# Patient Record
Sex: Male | Born: 1985 | Race: Black or African American | Hispanic: No | Marital: Married | Smoking: Never smoker
Health system: Southern US, Community
[De-identification: ages and names within clinical notes are randomized; demographics above are authoritative.]

---
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
## 2010-09-05 ENCOUNTER — Ambulatory Visit
Admission: RE | Admit: 2010-09-05 | Discharge: 2010-09-05 | Disposition: A | Discharge status: Home / Self Care | Payer: No Typology Code available for payment source | Source: Ambulatory Visit | Attending: Occupational Medicine | Admitting: Occupational Medicine

## 2010-09-05 ENCOUNTER — Other Ambulatory Visit: Payer: Self-pay | Admitting: Occupational Medicine

## 2010-09-05 DIAGNOSIS — Z021 Encounter for pre-employment examination: Secondary | ICD-10-CM

## 2012-05-13 IMAGING — CR DG CHEST 1V
1 series · 1 of 1 positions shown · non-contrast
Comparison: None.

CLINICAL DATA: Pre employment physical

CHEST - 1 VIEW

[view not recorded]
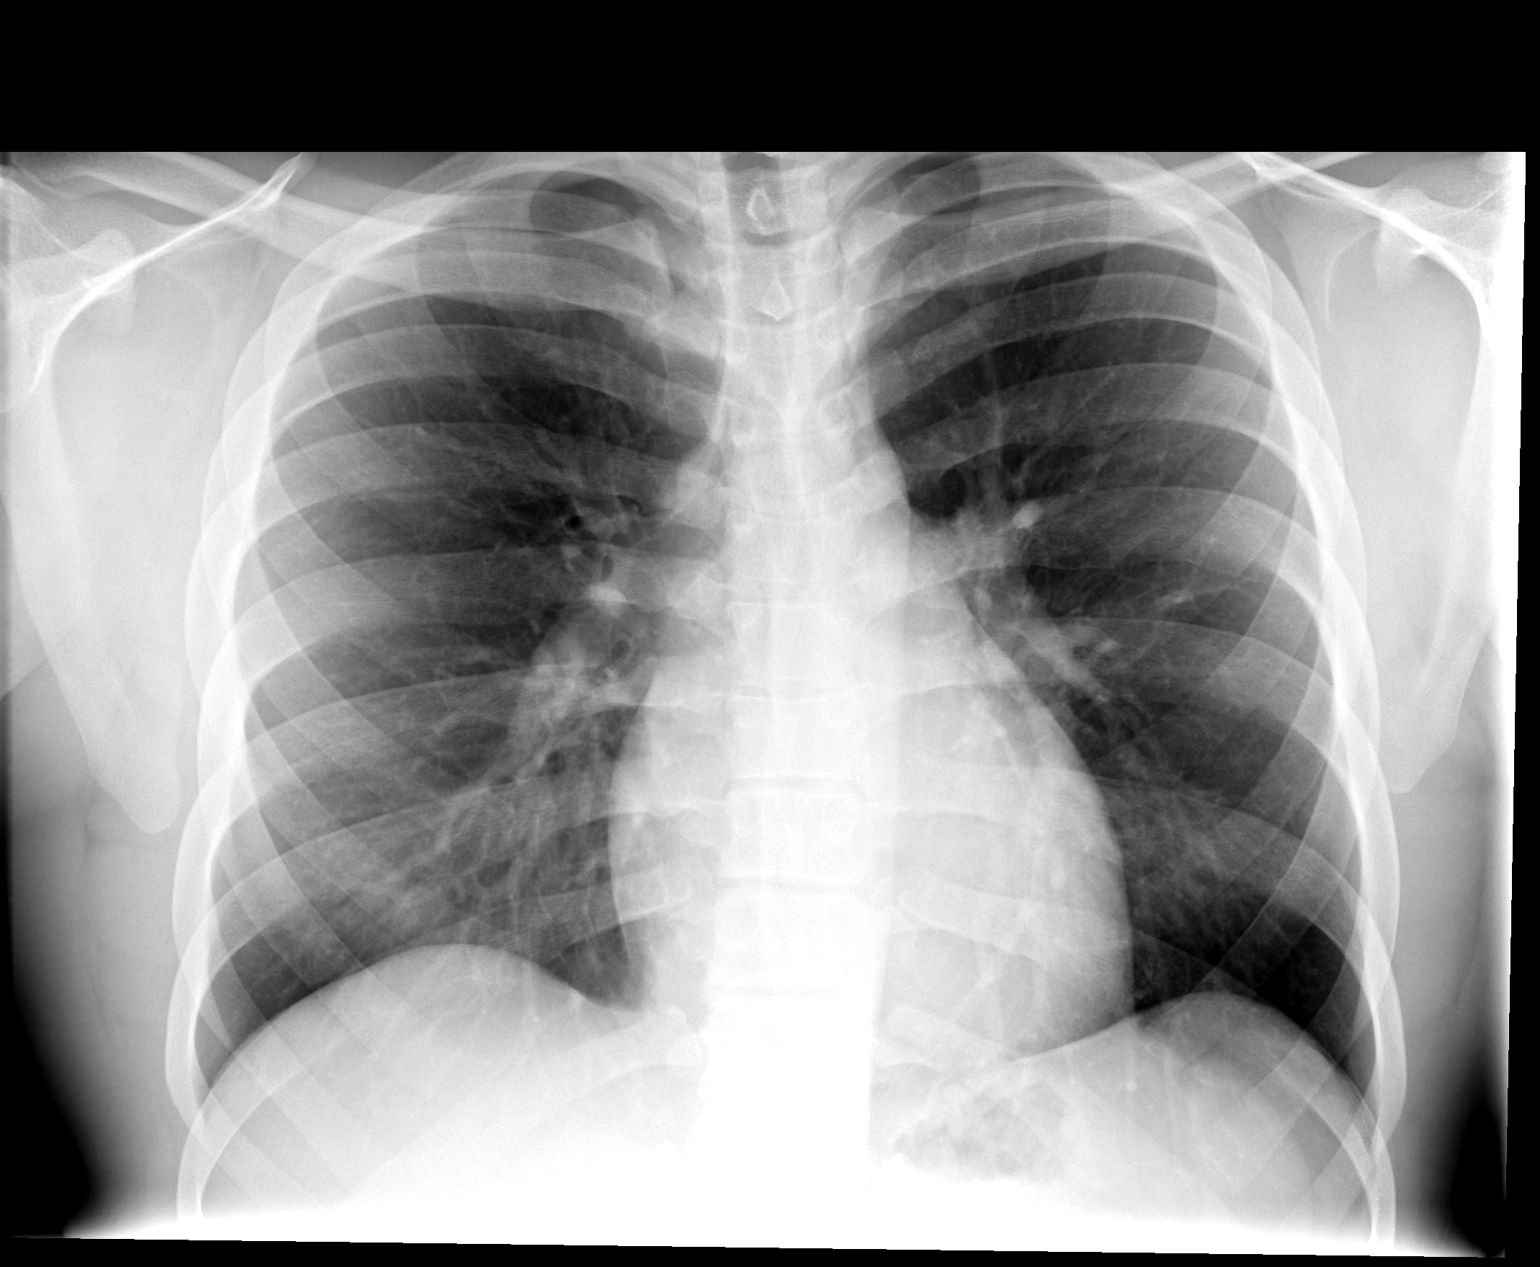

[1 of 1 positions shown; findings below may reference images not displayed]

FINDINGS: The lungs are clear.  Mediastinal contours appear normal.
The heart is within normal limits in size.  No bony abnormality is
seen.
IMPRESSION: No active lung disease.

## 2014-04-23 ENCOUNTER — Encounter (HOSPITAL_COMMUNITY): Payer: Self-pay | Admitting: *Deleted

## 2014-04-23 ENCOUNTER — Emergency Department (HOSPITAL_COMMUNITY)
Admission: EM | Admit: 2014-04-23 | Discharge: 2014-04-23 | Disposition: A | Discharge status: Home / Self Care | Payer: Worker's Compensation | Attending: Emergency Medicine | Admitting: Emergency Medicine

## 2014-04-23 DIAGNOSIS — W461XXA Contact with contaminated hypodermic needle, initial encounter: Secondary | ICD-10-CM

## 2014-04-23 DIAGNOSIS — Z7721 Contact with and (suspected) exposure to potentially hazardous body fluids: Secondary | ICD-10-CM | POA: Diagnosis present

## 2014-04-23 LAB — COMPREHENSIVE METABOLIC PANEL
ALK PHOS: 70 U/L (ref 39–117)
ALT: 24 U/L (ref 0–53)
AST: 26 U/L (ref 0–37)
Albumin: 4.8 g/dL (ref 3.5–5.2)
Anion gap: 9 (ref 5–15)
BUN: 18 mg/dL (ref 6–23)
CO2: 26 mmol/L (ref 19–32)
Calcium: 9.7 mg/dL (ref 8.4–10.5)
Chloride: 104 mEq/L (ref 96–112)
Creatinine, Ser: 0.99 mg/dL (ref 0.50–1.35)
GFR calc Af Amer: >90 mL/min (ref >90)
GLUCOSE: 79 mg/dL (ref 70–99)
POTASSIUM: 4.6 mmol/L (ref 3.5–5.1)
Sodium: 139 mmol/L (ref 135–145)
Total Bilirubin: 1.2 mg/dL (ref 0.3–1.2)
Total Protein: 7.3 g/dL (ref 6.0–8.3)

## 2014-04-23 LAB — CBC WITH DIFFERENTIAL/PLATELET
Basophils Absolute: 0 10*3/uL (ref 0.0–0.1)
Basophils Relative: 0 % (ref 0–1)
EOS ABS: 0.1 10*3/uL (ref 0.0–0.7)
Eosinophils Relative: 1 % (ref 0–5)
HCT: 47.1 % (ref 39.0–52.0)
HEMOGLOBIN: 15.8 g/dL (ref 13.0–17.0)
LYMPHS ABS: 1.6 10*3/uL (ref 0.7–4.0)
LYMPHS PCT: 24 % (ref 12–46)
MCH: 30.3 pg (ref 26.0–34.0)
MCHC: 33.5 g/dL (ref 30.0–36.0)
MCV: 90.4 fL (ref 78.0–100.0)
MONOS PCT: 7 % (ref 3–12)
Monocytes Absolute: 0.5 10*3/uL (ref 0.1–1.0)
NEUTROS PCT: 68 % (ref 43–77)
Neutro Abs: 4.6 10*3/uL (ref 1.7–7.7)
PLATELETS: 248 10*3/uL (ref 150–400)
RBC: 5.21 MIL/uL (ref 4.22–5.81)
RDW: 12.3 % (ref 11.5–15.5)
WBC: 6.8 10*3/uL (ref 4.0–10.5)

## 2014-04-23 LAB — AMYLASE: AMYLASE: 82 U/L (ref 0–105)

## 2014-04-23 LAB — RAPID HIV SCREEN (WH-MAU): Rapid HIV Screen: NONREACTIVE
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
## 2014-04-23 NOTE — Discharge Instructions (Signed)
Body Fluid Exposure Information °People may come into contact with blood and other body fluids under various circumstances. In some cases, body fluids may contain germs (bacteria or viruses) that cause infections. These germs can be spread when another person's body fluids come into contact with your skin, mouth, eyes, or genitals.  °Exposure to body fluids that may contain infectious material is a common problem for people providing care for others who are ill. It can occur when a person is performing health care tasks in the workplace or when taking care of a family member at home. Other common methods of exposure include injection drug use, sharing needles, and sexual activity. °The risk of an infection spreading through body fluid exposure is small and depends on a variety of factors. This includes the type of body fluid, the nature of the exposure, and the health status of the person who was the source of the body fluids. Your health care provider can help you assess the risk. °WHAT TYPES OF BODY FLUID CAN SPREAD INFECTION? °The following types of body fluid have the potential to spread infections: °· Blood. °· Semen. °· Vaginal secretions. °· Urine. °· Feces. °· Saliva. °· Nasal or eye discharge. °· Breast milk. °· Amniotic fluid and fluids surrounding body organs. °WHAT ARE SOME FIRST-AID MEASURES FOR BODY FLUID EXPOSURE? °The following steps should be taken as soon as possible after a person is exposed to body fluids: °Intact Skin °· For contact with closed skin, wash the area with soap and water. °Broken Skin °· For contact with broken skin (a wound), wash the area with soap and water. Let the area bleed a little. Then place a bandage or clean towel on the wound, applying gentle pressure to stop the bleeding. Do not squeeze or rub the area. °· Use just water or hand sanitizer if a sink with soap is not available. °· Do not use harsh chemicals such as bleach or iodine. °Eyes °· Rinse the eyes with water or  saline for 30 seconds. °· If the person is wearing contact lenses, leave the contact lenses in while rinsing the eyes. Once the rinsing is complete, remove the contact lenses. °Mouth °· Spit out the fluids. Rinse and spit with water 4-5 times. °In addition, you should remove any clothing that comes into contact with body fluids. However, if body fluid exposure results from sexual assault, seek medical care immediately without changing clothes or bathing. °WHEN SHOULD YOU SEEK HELP? °After performing the proper first-aid steps, you should contact your health care provider or seek emergency care right away if blood or other body fluids made contact with areas of broken skin or openings such as the eyes or mouth. If the exposure to body fluid happened in the workplace, you should report it to your work supervisor immediately. Many workplaces have procedures in place for exposure situations. °WHAT WILL HAPPEN AFTER YOU REPORT THE EXPOSURE? °Your health care provider will ask you several questions. Information requested may include: °· Your medical history, including vaccination records. °· Date and time of the exposure. °· Whether you saw body fluids during the exposure. °· Type of body fluid you were exposed to. °· Volume of body fluid you were exposed to. °· How the exposure happened. °· If any devices, such as a needle, were being used. °· Which area of your body made contact with the body fluid. °· Description of any injury to the skin or other area. °· How long contact was made with the body   fluid. °· Any information you have about the health status of the person whose body fluid you were exposed to. °The health care provider will assess your risk of infection. Often, no treatment is necessary. In some cases, the health care provider may recommend doing blood tests right away. Follow-up blood tests may also be done at certain intervals during the upcoming weeks and months to check for changes. You may be offered  treatment to prevent an infection from developing after exposure (post-exposure prophylaxis). This may include certain vaccinations or medicines and may be necessary when there is a risk of a serious infection, such as HIV or hepatitis B. Your health care provider should discuss appropriate treatment and vaccinations with you. °HOW CAN YOU PREVENT EXPOSURE AND INFECTION? °Always remember that prevention is the first line of defense against body fluid exposure. To help prevent exposure to body fluids: °· Wash and disinfect countertops and other surfaces regularly. °· Wear appropriate protective gear such as gloves, gowns, or eyewear when the possibility of exposure is present. °· Wipe away spills of body fluid with disposable towels. °· Properly dispose of blood products and other fluids. Use secured bags. °· Properly dispose of needles and other instruments with sharp points or edges (sharps). Use closed, marked containers. °· Avoid injection drug use. °· Do not share needles. °· Avoid recapping needles. °· Use a condom during sexual intercourse. °· Make sure you learn and follow any guidelines for preventing exposure (universal precautions) provided at your workplace. °To help reduce your chances of getting an infection: °· Make sure your vaccinations are up-to-date, including those for tetanus and hepatitis. °· Wash your hands frequently with soap and water. Use hand sanitizers. °· Avoid having multiple sex partners. °· Follow up with your health care provider as directed after being evaluated for an exposure to body fluids. °To avoid spreading infection to others: °· Do not have sexual relations until you know you are free of infection. °· Do not donate blood, plasma, breast milk, sperm, or other body fluids. °· Do not share hygiene tools such as toothbrushes, razors, or dental floss. °· Keep open wounds covered. °· Dispose of any items with blood on them (razors, tampons, bandages) by putting them in the  trash. °· Do not share drug supplies with others, such as needles, syringes, straws, or pipes. °· Follow all of your health care provider's instructions for preventing the spread of infection. °Document Released: 12/14/2012 Document Revised: 04/18/2013 Document Reviewed: 12/14/2012 °Exit[employer]® Patient Information ©2015 [employer]. This information is not intended to replace advice given to you by your health care provider. Make sure you discuss any questions you have with your health care provider. ° °

## 2014-04-24 LAB — HEPATITIS PANEL, ACUTE
HCV AB: NEGATIVE
HEP A IGM: NONREACTIVE
Hep B C IgM: NONREACTIVE
Hepatitis B Surface Ag: NEGATIVE
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
## 2015-06-27 ENCOUNTER — Ambulatory Visit (INDEPENDENT_AMBULATORY_CARE_PROVIDER_SITE_OTHER): Payer: 59 | Admitting: Family Medicine

## 2015-06-27 ENCOUNTER — Ambulatory Visit (INDEPENDENT_AMBULATORY_CARE_PROVIDER_SITE_OTHER): Payer: 59

## 2015-06-27 VITALS — BP 122/70 | HR 64 | Temp 98.1°F | Resp 16 | Ht 72.0 in | Wt 213.6 lb

## 2015-06-27 DIAGNOSIS — S83411A Sprain of medial collateral ligament of right knee, initial encounter: Secondary | ICD-10-CM | POA: Diagnosis not present

## 2015-06-27 DIAGNOSIS — S8991XA Unspecified injury of right lower leg, initial encounter: Secondary | ICD-10-CM

## 2015-06-27 MED ORDER — DICLOFENAC SODIUM 75 MG PO TBEC: 75.0000 mg | DELAYED_RELEASE_TABLET | Freq: Two times a day (BID) | ORAL | Status: AC

## 2015-06-27 NOTE — Progress Notes (Signed)
Patient ID: LEONCIO HANSEN, male    DOB: 05-06-1985  Age: 30 y.o. MRN: [id number]  Chief Complaint  Patient presents with  . Knee Injury    right, last night playing basketball    Subjective:   Patient was playing basketball last night and red for a ball. He did not step on anyone's foot or anything but his knee gave way. He felt a pop on the lateral aspect of the right knee and it gave out on him. He had pain on both sides of the knee. He has persisted with pain. He did ice it for a couple of hours, stop playing ball, and went home and rested it last night. This morning he notices it is swollen a little bit. It is very painful. He cannot fully extend it without pain. Current allergies, medications, problem list, past/family and social histories reviewed.  Objective:  BP 122/70 mmHg  Pulse 64  Temp(Src) 98.1 F (36.7 C) (Oral)  Resp 16  Ht 6' (1.829 m)  Wt 213 lb 9.6 oz (96.888 kg)  BMI 28.96 kg/m2  SpO2 98%  Very tender medially on the right knee joint over the area of the medial collateral ligament. Laterally it is also a little tender, not like medially. Mild effusion palpable. Extension causes pain. There does not seem to be any weakness on the drawer sign. However it is difficult to test the medial aspect of the knee due to the pain.  Assessment & Plan:   Assessment: 1. Knee injury, right, initial encounter   2. Sprain and strain of medial collateral ligament of knee, right, initial encounter       Plan: X-ray knee. Probably needs referral onto a sports medicine and/or orthopedic specialist.  Orders Placed This Encounter  Procedures  . DG Knee Complete 4 Views Left    Order Specific Question:  Reason for Exam (SYMPTOM  OR DIAGNOSIS REQUIRED)    Answer:  injured in basketball.  Tender right medial, less tenderness lateral but felt a pop on the lateral aspect    Order Specific Question:  Preferred imaging location?    Answer:  External   X-ray appears normal. No  orders of the defined types were placed in this encounter.         There are no Patient Instructions on file for this visit.   Return in about 1 week (around 07/04/2015), or if symptoms worsen or fail to improve.   HOPPER,DAVID, MD 06/27/2015

## 2015-06-27 NOTE — Patient Instructions (Addendum)
Continue to ice knee for 15 or 20 minutes multiple times a day.  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  Referral will be made to sports medicine specialist  If you have not gotten in to the specialist by 1 week from now please return here for a recheck.  Diclofenac 75 mg one twice daily for pain and inflammation  You can take Tylenol 1000 mg (2500 mg) 3 times daily if you need additional pain relief.  Because you received an x-ray today, you will receive an invoice from [facility]. Please contact [facility] at [phone] with questions or concerns regarding your invoice. Our billing staff will not be able to assist you with those questions.

## 2016-06-09 DIAGNOSIS — M9903 Segmental and somatic dysfunction of lumbar region: Secondary | ICD-10-CM | POA: Diagnosis not present

## 2016-06-09 DIAGNOSIS — M9901 Segmental and somatic dysfunction of cervical region: Secondary | ICD-10-CM | POA: Diagnosis not present

## 2016-06-09 DIAGNOSIS — M9902 Segmental and somatic dysfunction of thoracic region: Secondary | ICD-10-CM | POA: Diagnosis not present

## 2016-09-14 DIAGNOSIS — G479 Sleep disorder, unspecified: Secondary | ICD-10-CM | POA: Diagnosis not present

## 2016-12-19 DIAGNOSIS — Z23 Encounter for immunization: Secondary | ICD-10-CM | POA: Diagnosis not present

## 2017-03-04 IMAGING — CR DG KNEE COMPLETE 4+V*R*
4 series · 4 of 4 positions shown · non-contrast
Comparison: None.

CLINICAL DATA: Knee pain after playing basketball

EXAM:
LEFT KNEE - COMPLETE 4+ VIEW

[AP]
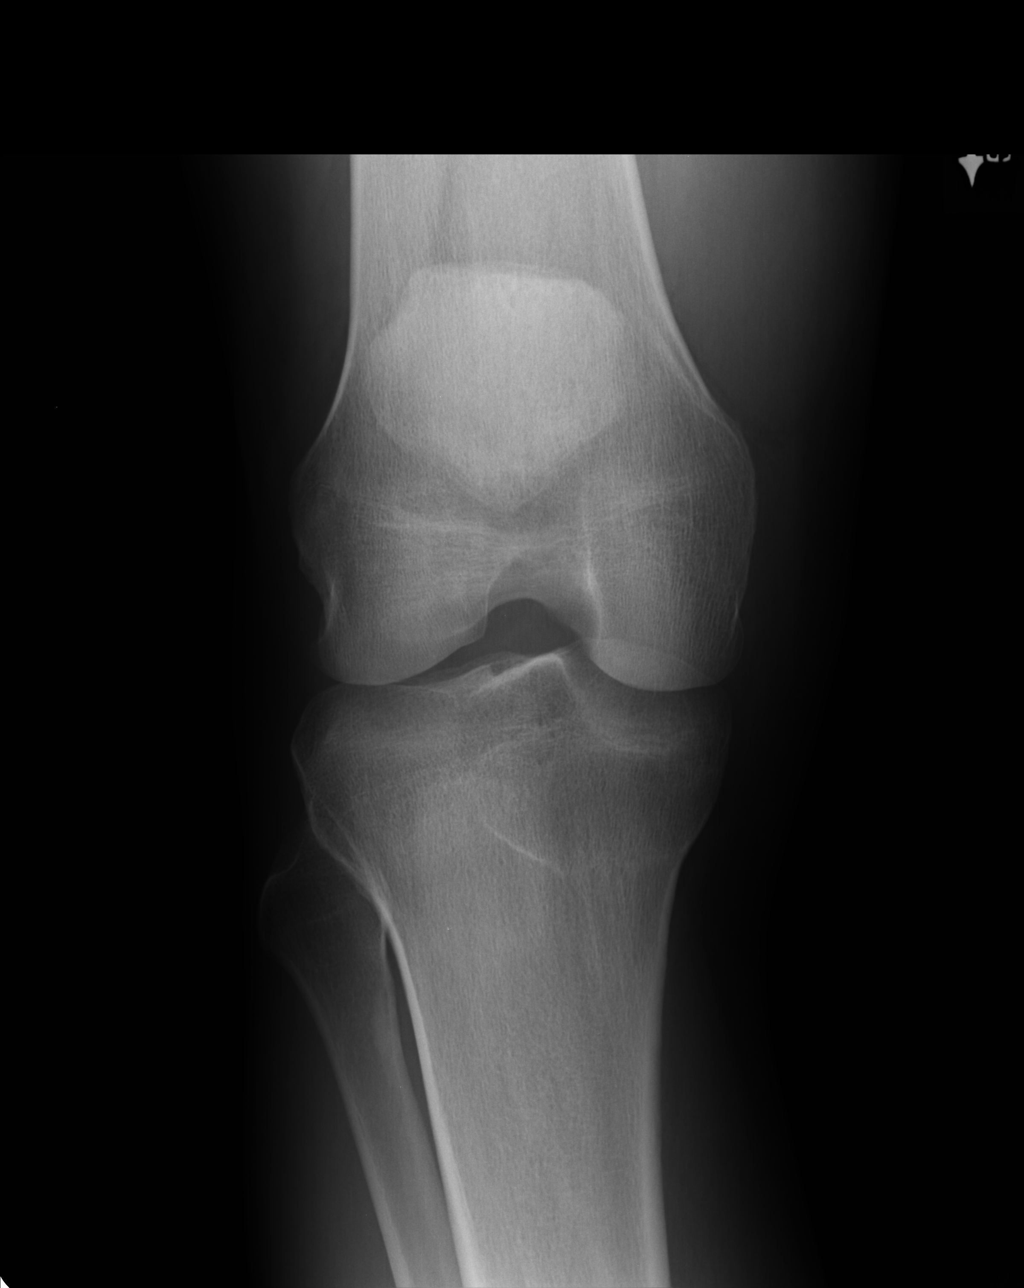

[ap axial]
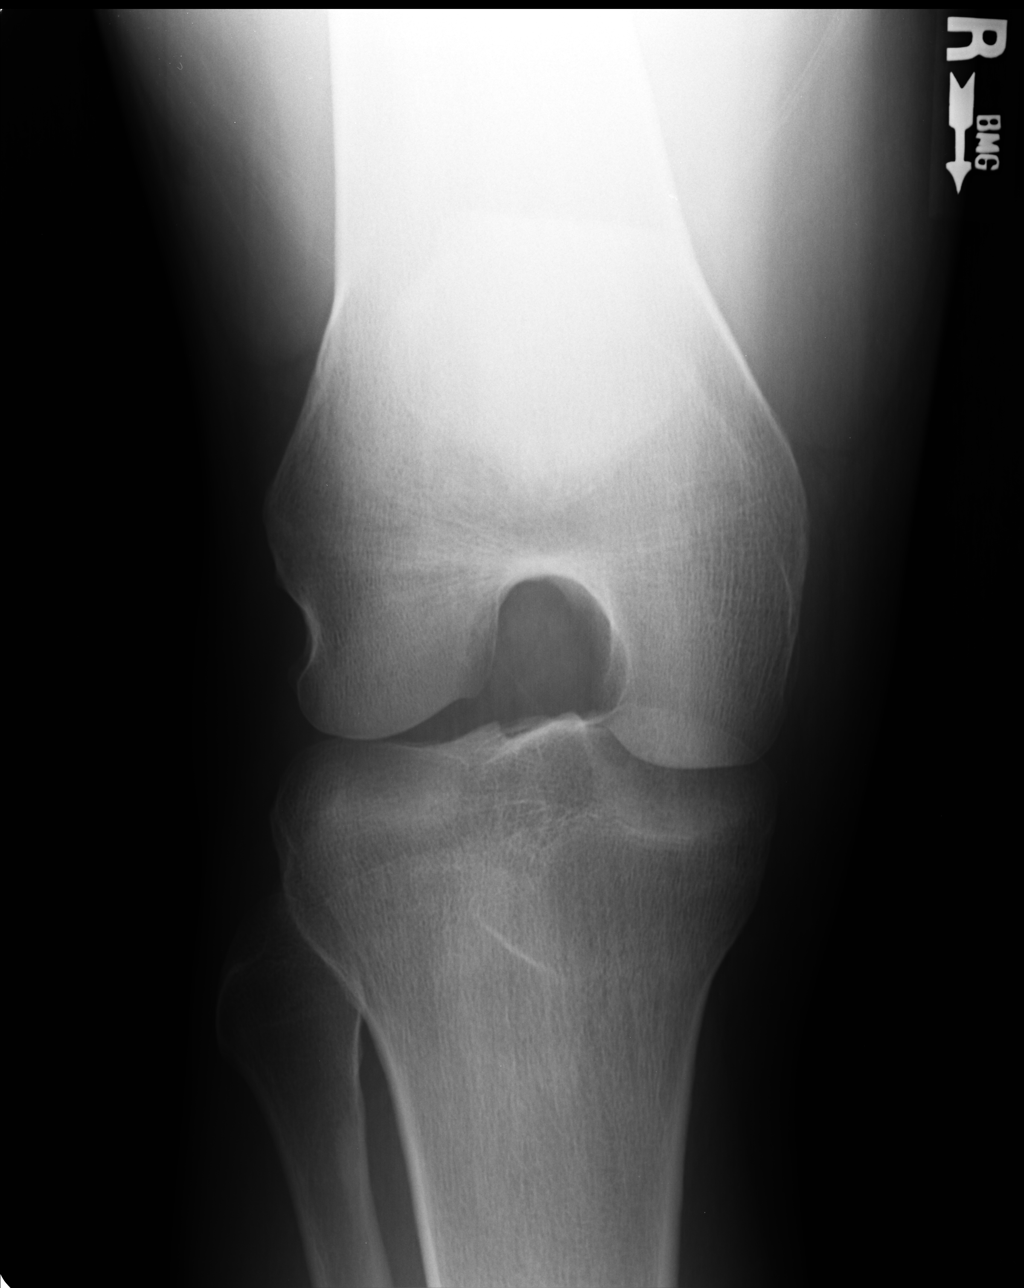

[lateral]
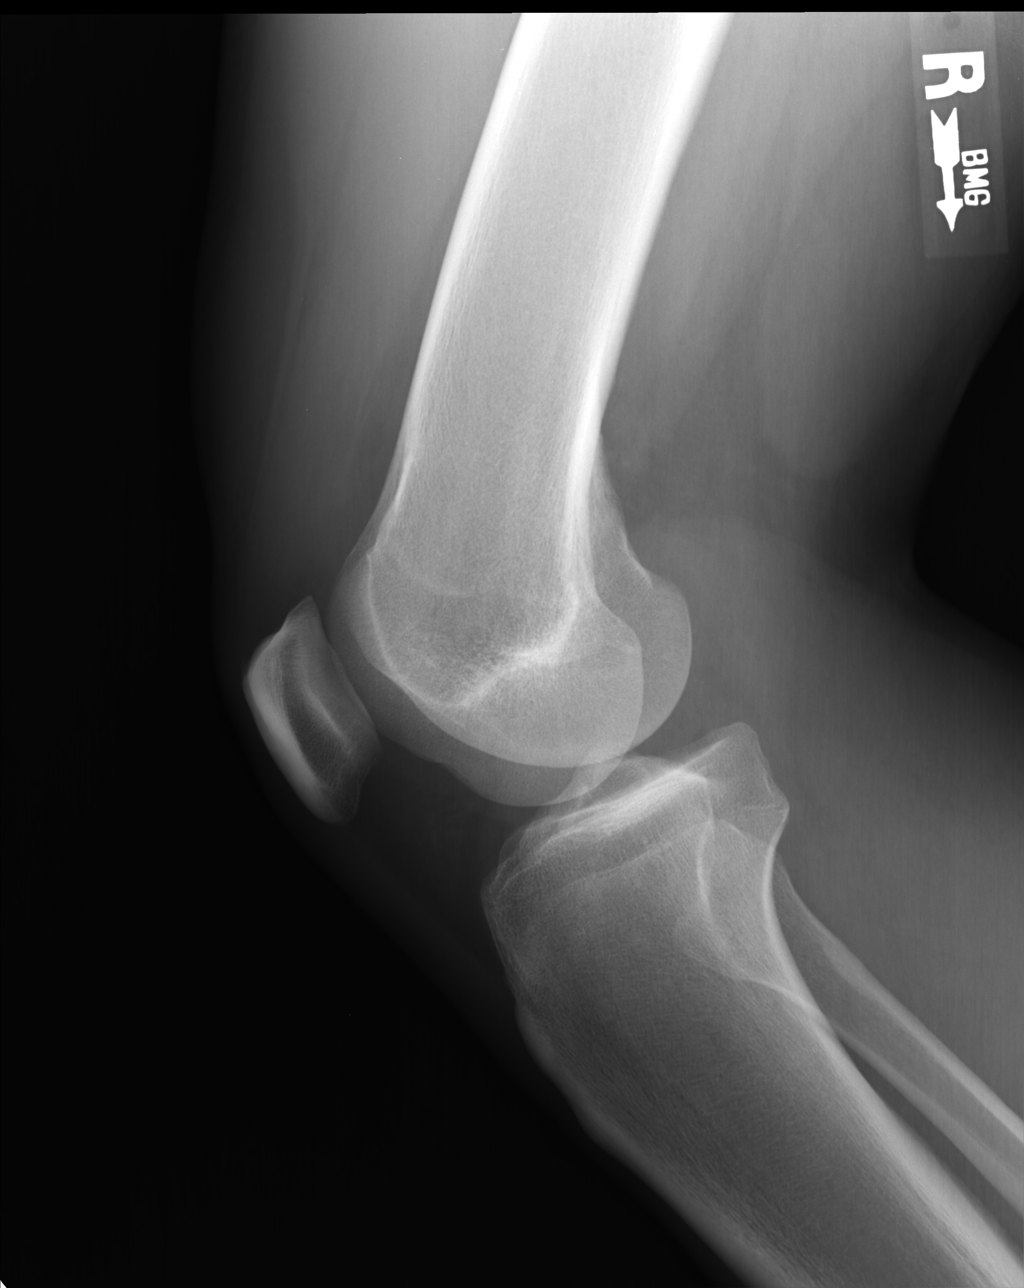

[sunrise]
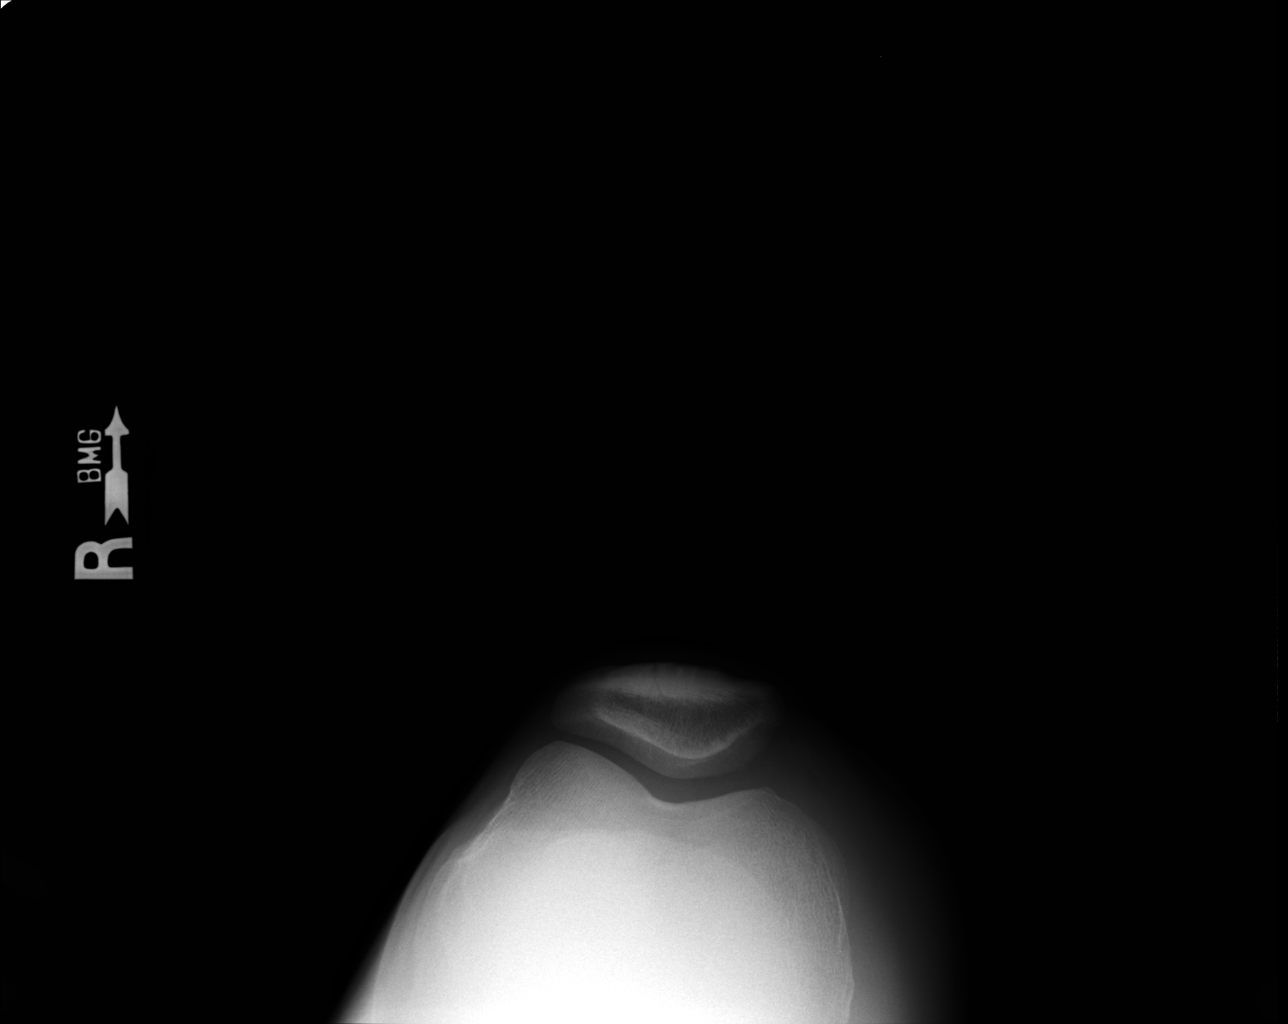

[4 of 4 positions shown; findings below may reference images not displayed]

FINDINGS: The right knee joint spaces appear normal for age. No significant
degenerative change or fracture is noted. A small amount of joint
fluid cannot be excluded. The patella is normally positioned.
IMPRESSION: No acute fracture.  Cannot exclude a small amount of joint fluid.

## 2017-04-29 DIAGNOSIS — Z0000 Encounter for general adult medical examination without abnormal findings: Secondary | ICD-10-CM | POA: Diagnosis not present

## 2017-04-29 DIAGNOSIS — Z13220 Encounter for screening for lipoid disorders: Secondary | ICD-10-CM | POA: Diagnosis not present

## 2017-04-29 DIAGNOSIS — Z131 Encounter for screening for diabetes mellitus: Secondary | ICD-10-CM | POA: Diagnosis not present

## 2017-05-11 DIAGNOSIS — M9902 Segmental and somatic dysfunction of thoracic region: Secondary | ICD-10-CM | POA: Diagnosis not present

## 2017-05-11 DIAGNOSIS — M9901 Segmental and somatic dysfunction of cervical region: Secondary | ICD-10-CM | POA: Diagnosis not present

## 2017-05-11 DIAGNOSIS — M9903 Segmental and somatic dysfunction of lumbar region: Secondary | ICD-10-CM | POA: Diagnosis not present

## 2017-07-09 DIAGNOSIS — M9902 Segmental and somatic dysfunction of thoracic region: Secondary | ICD-10-CM | POA: Diagnosis not present

## 2017-07-09 DIAGNOSIS — M9901 Segmental and somatic dysfunction of cervical region: Secondary | ICD-10-CM | POA: Diagnosis not present

## 2017-07-09 DIAGNOSIS — M9903 Segmental and somatic dysfunction of lumbar region: Secondary | ICD-10-CM | POA: Diagnosis not present

## 2017-11-01 DIAGNOSIS — M9901 Segmental and somatic dysfunction of cervical region: Secondary | ICD-10-CM | POA: Diagnosis not present

## 2017-11-01 DIAGNOSIS — M9903 Segmental and somatic dysfunction of lumbar region: Secondary | ICD-10-CM | POA: Diagnosis not present

## 2017-11-01 DIAGNOSIS — M9902 Segmental and somatic dysfunction of thoracic region: Secondary | ICD-10-CM | POA: Diagnosis not present

## 2017-11-03 DIAGNOSIS — B349 Viral infection, unspecified: Secondary | ICD-10-CM | POA: Diagnosis not present

## 2018-01-16 DIAGNOSIS — Z23 Encounter for immunization: Secondary | ICD-10-CM | POA: Diagnosis not present

## 2018-02-15 DIAGNOSIS — H00015 Hordeolum externum left lower eyelid: Secondary | ICD-10-CM | POA: Diagnosis not present

## 2018-03-08 DIAGNOSIS — H0015 Chalazion left lower eyelid: Secondary | ICD-10-CM | POA: Diagnosis not present

## 2018-03-22 DIAGNOSIS — H0015 Chalazion left lower eyelid: Secondary | ICD-10-CM | POA: Diagnosis not present

## 2018-04-29 DIAGNOSIS — H0015 Chalazion left lower eyelid: Secondary | ICD-10-CM | POA: Diagnosis not present

## 2022-02-28 ENCOUNTER — Other Ambulatory Visit: Payer: Self-pay

## 2022-02-28 ENCOUNTER — Encounter (HOSPITAL_COMMUNITY): Payer: Self-pay | Admitting: *Deleted

## 2022-02-28 ENCOUNTER — Emergency Department (HOSPITAL_COMMUNITY)
Admission: EM | Admit: 2022-02-28 | Discharge: 2022-02-28 | Disposition: A | Discharge status: Home / Self Care | Payer: No Typology Code available for payment source | Attending: Emergency Medicine | Admitting: Emergency Medicine

## 2022-02-28 DIAGNOSIS — Y990 Civilian activity done for income or pay: Secondary | ICD-10-CM | POA: Insufficient documentation

## 2022-02-28 DIAGNOSIS — W268XXA Contact with other sharp object(s), not elsewhere classified, initial encounter: Secondary | ICD-10-CM | POA: Diagnosis not present

## 2022-02-28 DIAGNOSIS — S51812A Laceration without foreign body of left forearm, initial encounter: Secondary | ICD-10-CM | POA: Diagnosis not present

## 2022-02-28 DIAGNOSIS — S59912A Unspecified injury of left forearm, initial encounter: Secondary | ICD-10-CM | POA: Diagnosis present
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
## 2022-02-28 NOTE — ED Triage Notes (Signed)
The pt scratched his lt forearm last night the wound has continued to bleed.

## 2022-02-28 NOTE — Discharge Instructions (Signed)
Please read and follow all provided instructions.  Your diagnoses today include:  1. Laceration of left forearm, initial encounter    Tests performed today include: Vital signs. See below for your results today.   Medications prescribed:  Please use over-the-counter NSAID medications (ibuprofen, naproxen) or Tylenol (acetaminophen) as directed on the packaging for pain -- as long as you do not have any reasons avoid these medications. Reasons to avoid NSAID medications include: weak kidneys, a history of bleeding in your stomach or gut, or uncontrolled high blood pressure or previous heart attack. Reasons to avoid Tylenol include: liver problems or ongoing alcohol use. Never take more than 4000mg  or 8 Extra strength Tylenol in a 24 hour period.     Take any prescribed medications only as directed.   Home care instructions:  Follow any educational materials and wound care instructions contained in this packet.   Keep affected area above the level of your heart when possible to minimize swelling. Wash area gently twice a day with warm soapy water. Do not apply alcohol or hydrogen peroxide. Cover the area if it draining or weeping.   Return instructions:  Return to the Emergency Department if you have: Fever Worsening pain Worsening swelling of the wound Pus draining from the wound Redness of the skin that moves away from the wound, especially if it streaks away from the affected area  Any other emergent concerns  Your vital signs today were: BP 131/77 (BP Location: Right Arm)   Pulse 66   Temp 98.3 F (36.8 C) (Oral)   Resp 16   SpO2 97%  If your blood pressure (BP) was elevated above 135/85 this visit, please have this repeated by your doctor within one month. --------------
